# Patient Record
Sex: Female | Born: 1995 | Race: Black or African American | Hispanic: No | Marital: Single | Smoking: Never smoker
Health system: Southern US, Community
[De-identification: ages and names within clinical notes are randomized; demographics above are authoritative.]

## PROBLEM LIST (undated history)

## (undated) DIAGNOSIS — J45909 Unspecified asthma, uncomplicated: Secondary | ICD-10-CM

## (undated) DIAGNOSIS — B019 Varicella without complication: Secondary | ICD-10-CM

## (undated) HISTORY — PX: WISDOM TOOTH EXTRACTION: SHX21

## (undated) HISTORY — DX: Varicella without complication: B01.9

---
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
## 2016-07-23 ENCOUNTER — Emergency Department (HOSPITAL_COMMUNITY)
Admission: EM | Admit: 2016-07-23 | Discharge: 2016-07-23 | Disposition: A | Discharge status: Home / Self Care | Payer: Managed Care, Other (non HMO) | Attending: Emergency Medicine | Admitting: Emergency Medicine

## 2016-07-23 ENCOUNTER — Emergency Department (HOSPITAL_COMMUNITY): Payer: Managed Care, Other (non HMO)

## 2016-07-23 ENCOUNTER — Encounter (HOSPITAL_COMMUNITY): Payer: Self-pay | Admitting: Emergency Medicine

## 2016-07-23 DIAGNOSIS — S0990XA Unspecified injury of head, initial encounter: Secondary | ICD-10-CM | POA: Diagnosis present

## 2016-07-23 DIAGNOSIS — J45909 Unspecified asthma, uncomplicated: Secondary | ICD-10-CM | POA: Diagnosis not present

## 2016-07-23 DIAGNOSIS — S161XXA Strain of muscle, fascia and tendon at neck level, initial encounter: Secondary | ICD-10-CM | POA: Insufficient documentation

## 2016-07-23 DIAGNOSIS — R110 Nausea: Secondary | ICD-10-CM | POA: Insufficient documentation

## 2016-07-23 DIAGNOSIS — Z79899 Other long term (current) drug therapy: Secondary | ICD-10-CM | POA: Diagnosis not present

## 2016-07-23 DIAGNOSIS — Y999 Unspecified external cause status: Secondary | ICD-10-CM | POA: Diagnosis not present

## 2016-07-23 DIAGNOSIS — Y929 Unspecified place or not applicable: Secondary | ICD-10-CM | POA: Insufficient documentation

## 2016-07-23 DIAGNOSIS — Y939 Activity, unspecified: Secondary | ICD-10-CM | POA: Diagnosis not present

## 2016-07-23 DIAGNOSIS — R51 Headache: Secondary | ICD-10-CM | POA: Diagnosis not present

## 2016-07-23 MED ORDER — NAPROXEN 500 MG PO TABS: 500.0000 mg | ORAL_TABLET | Freq: Two times a day (BID) | ORAL | 0 refills | Status: DC

## 2016-07-23 MED ORDER — CYCLOBENZAPRINE HCL 10 MG PO TABS: 10.0000 mg | ORAL_TABLET | Freq: Two times a day (BID) | ORAL | 0 refills | Status: DC | PRN

## 2016-07-23 MED ORDER — ACETAMINOPHEN 325 MG PO TABS
650.0000 mg | ORAL_TABLET | Freq: Once | ORAL | Status: AC
  Administered 2016-07-23: 650 mg via ORAL
  Filled 2016-07-23: qty 2

## 2016-07-23 NOTE — ED Notes (Signed)
Mother has same up to the nurses station, getting in nurses face reporting "she was not like the other people in the waiting room, she was a attending physician". Informed Tatyana, PA of patients mother reaction. Also, mother wanted to know about pain medication. New order for pain medication was given.

## 2016-07-23 NOTE — Discharge Instructions (Signed)
Alternate between ice and heat. Naprosyn for pain. Flexeril at bed time for muscle spasms. Try stretches. Follow up with family doctor as needed.  Your CTs were normal today.
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
## 2016-10-31 ENCOUNTER — Encounter: Payer: Self-pay | Admitting: Nurse Practitioner

## 2016-10-31 ENCOUNTER — Ambulatory Visit (INDEPENDENT_AMBULATORY_CARE_PROVIDER_SITE_OTHER): Payer: Managed Care, Other (non HMO) | Admitting: Nurse Practitioner

## 2016-10-31 VITALS — BP 118/90 | HR 65 | Temp 97.8°F | Ht 63.0 in | Wt 137.0 lb

## 2016-10-31 DIAGNOSIS — J45901 Unspecified asthma with (acute) exacerbation: Secondary | ICD-10-CM

## 2016-10-31 DIAGNOSIS — J0140 Acute pansinusitis, unspecified: Secondary | ICD-10-CM

## 2016-10-31 MED ORDER — DM-GUAIFENESIN ER 30-600 MG PO TB12: 1.0000 | ORAL_TABLET | Freq: Two times a day (BID) | ORAL | 0 refills | Status: AC | PRN

## 2016-10-31 MED ORDER — METHYLPREDNISOLONE 4 MG PO TBPK: ORAL_TABLET | ORAL | 0 refills | Status: AC

## 2016-10-31 MED ORDER — FLUCONAZOLE 150 MG PO TABS: 150.0000 mg | ORAL_TABLET | Freq: Once | ORAL | 0 refills | Status: AC

## 2016-10-31 MED ORDER — AMOXICILLIN-POT CLAVULANATE 875-125 MG PO TABS: 1.0000 | ORAL_TABLET | Freq: Two times a day (BID) | ORAL | 0 refills | Status: AC

## 2016-10-31 NOTE — Patient Instructions (Signed)
Encourage adequate oral hydration  Continue use of albuterol prn.
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
